# Patient Record
Sex: Female | Born: 2005
Health system: Southern US, Community
[De-identification: ages and names within clinical notes are randomized; demographics above are authoritative.]

## PROBLEM LIST (undated history)

## (undated) DIAGNOSIS — M41129 Adolescent idiopathic scoliosis, site unspecified: Secondary | ICD-10-CM

## (undated) DIAGNOSIS — H6993 Unspecified Eustachian tube disorder, bilateral: Secondary | ICD-10-CM

## (undated) DIAGNOSIS — J45909 Unspecified asthma, uncomplicated: Secondary | ICD-10-CM

## (undated) HISTORY — DX: Unspecified asthma, uncomplicated: J45.909

## (undated) HISTORY — DX: Unspecified eustachian tube disorder, bilateral: H69.93

## (undated) HISTORY — PX: OTHER SURGICAL HISTORY: SHX169

## (undated) HISTORY — DX: Adolescent idiopathic scoliosis, site unspecified: M41.129

## (undated) HISTORY — PX: ADENOIDECTOMY: SUR15

## (undated) HISTORY — PX: TYMPANOSTOMY TUBE PLACEMENT: SHX32

---
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
## 2023-01-12 DIAGNOSIS — M41124 Adolescent idiopathic scoliosis, thoracic region: Secondary | ICD-10-CM | POA: Diagnosis not present

## 2023-01-12 DIAGNOSIS — M549 Dorsalgia, unspecified: Secondary | ICD-10-CM | POA: Diagnosis not present

## 2023-01-12 DIAGNOSIS — M545 Low back pain, unspecified: Secondary | ICD-10-CM | POA: Diagnosis not present

## 2023-02-01 DIAGNOSIS — M545 Low back pain, unspecified: Secondary | ICD-10-CM | POA: Diagnosis not present

## 2023-02-01 DIAGNOSIS — M41124 Adolescent idiopathic scoliosis, thoracic region: Secondary | ICD-10-CM | POA: Diagnosis not present

## 2023-04-12 DIAGNOSIS — M4134 Thoracogenic scoliosis, thoracic region: Secondary | ICD-10-CM | POA: Diagnosis not present

## 2023-04-12 DIAGNOSIS — G4486 Cervicogenic headache: Secondary | ICD-10-CM | POA: Diagnosis not present

## 2023-04-12 DIAGNOSIS — M41124 Adolescent idiopathic scoliosis, thoracic region: Secondary | ICD-10-CM | POA: Diagnosis not present

## 2023-04-12 DIAGNOSIS — M545 Low back pain, unspecified: Secondary | ICD-10-CM | POA: Diagnosis not present

## 2023-04-12 DIAGNOSIS — M4312 Spondylolisthesis, cervical region: Secondary | ICD-10-CM | POA: Diagnosis not present

## 2023-07-11 DIAGNOSIS — G4486 Cervicogenic headache: Secondary | ICD-10-CM | POA: Diagnosis not present

## 2023-07-11 DIAGNOSIS — M545 Low back pain, unspecified: Secondary | ICD-10-CM | POA: Diagnosis not present

## 2023-07-11 DIAGNOSIS — M41124 Adolescent idiopathic scoliosis, thoracic region: Secondary | ICD-10-CM | POA: Diagnosis not present

## 2023-07-11 DIAGNOSIS — M41114 Juvenile idiopathic scoliosis, thoracic region: Secondary | ICD-10-CM | POA: Diagnosis not present

## 2024-04-12 DIAGNOSIS — H5213 Myopia, bilateral: Secondary | ICD-10-CM | POA: Diagnosis not present

## 2024-04-12 DIAGNOSIS — H1045 Other chronic allergic conjunctivitis: Secondary | ICD-10-CM | POA: Diagnosis not present

## 2024-09-25 ENCOUNTER — Ambulatory Visit (INDEPENDENT_AMBULATORY_CARE_PROVIDER_SITE_OTHER)

## 2024-09-25 VITALS — BP 98/80 | HR 111 | Ht 64.0 in | Wt 117.4 lb

## 2024-09-25 DIAGNOSIS — M419 Scoliosis, unspecified: Secondary | ICD-10-CM | POA: Diagnosis not present

## 2024-09-25 DIAGNOSIS — R000 Tachycardia, unspecified: Secondary | ICD-10-CM

## 2024-09-25 NOTE — Progress Notes (Signed)
 New Patient Visit   Physician: Lynley Killilea A Kearston Putman, MD  Patient: Karen Norris   DOB: March 18, 2006   18 y.o. Female  MRN: [id number] Visit Date: 09/25/2024   Chief Complaint  Patient presents with   Establish Care   Subjective  Karen Norris is a 18 y.o. female who presents today as a new patient to establish care.   HPI  Discussed the use of AI scribe software for clinical note transcription with the patient, who gave verbal consent to proceed.  History of Present Illness   Karen Norris is a 18 year old female who presents to establish care   Tachycardia and associated symptoms - HR during exercise normal - Lightheadedness associated with elevated heart rate, but this may just be exertional related - No current use of medications or supplements - Past EKG demonstrated tachycardia  Childhood asthma - Diagnosed in childhood - Currently asymptomatic, including absence of cough or wheezing even with exercise  Musculoskeletal pain and scoliosis - History of scoliosis causing pain after work - has seen orthopedics.  Thoracolumbar. - Previous physical therapy without significant improvement  Knee injury - History of meniscus tear  Covid-19 infections - Three prior COVID-19 infections, with symptoms worsening with each subsequent infection       ASSESSMENT & PLAN  Encounter Diagnoses  Name Primary?   Tachycardia Yes   Scoliosis of thoracolumbar spine, unspecified scoliosis type     No orders of the defined types were placed in this encounter.   Assessment and Plan    Tachycardia Persistent sinus tachycardia with heart rate >110-130 bpm in office. Differential includes anxiety hyperthyroidism, anemia, or arrhythmia. Previous EKGs showed tachycardia  - Order EKG to obtain baseline - showing overall unremarkable results, some artifact.  - Blood tests for thyroid function and anemia.  Monitor HR at home  - Consider Holter monitor if initial tests inconclusive,  but suspect this is more anxiety induced.  - Schedule follow-up call next week for lab results and management.  Scoliosis Scoliosis with back pain post-work. Previous physical therapy ineffective. Therapy may prevent worsening and maintain muscle tone.  General Health Maintenance Family history of breast and prostate cancer. Discussed early mammogram screening and flu vaccination. - Recommend mammograms at age 30 due to family history. - Offer flu vaccine for current season - declined     Objective  BP 98/80   Pulse (!) 111   Ht 5' 4 (1.626 m)   Wt 117 lb 6.4 oz (53.3 kg)   LMP 09/13/2024 (Approximate)   SpO2 99%   BMI 20.15 kg/m    Review of Systems  Constitutional:  Negative for chills, fever and weight loss.  Eyes:  Negative for blurred vision. h Respiratory:  Negative for cough and shortness of breath.   Cardiovascular:  Negative for chest pain and palpitations.  Skin:  Negative for rash.  Psychiatric/Behavioral:  Negative for depression. The patient is not nervous/anxious.      Physical Exam Physical Exam Vitals reviewed.  Constitutional:      Appearance: Normal appearance. Well-developed with normal weight.  HENT:     Head: Normocephalic and atraumatic.  Normal mucous membranes, no oral lesions Eyes:     Pupils: Pupils are equal, round, and reactive to light.  Neck:     Thyroid: No thyroid mass or thyromegaly.  Cardiovascular:     Rate and Rhythm: Elevated rate and regular rhythm. Normal heart sounds. Normal peripheral pulses Pulmonary:     Normal  breath sounds with normal effort Abdominal:   Abdomen is soft, without tenderness or noted hepatosplenomegaly Musculoskeletal:        General: No swelling or edema  Lymphadenopathy:     Cervical: No cervical adenopathy.  Skin:    General: Skin is warm and dry without noticeable rash. Neurological:     General: No focal deficit present.  Psychiatric:        Mood and Affect: Mood, behavior and cognition normal    Past Medical History:  Diagnosis Date   Adolescent scoliosis    Dysfunction of both eustachian tubes    Past Surgical History:  Procedure Laterality Date   ADENOIDECTOMY     MYRINGOTOMY W/ TUBES     TYMPANOSTOMY TUBE PLACEMENT     No family status information on file.   No family history on file. Social History   Socioeconomic History   Marital status: Single    Spouse name: Not on file   Number of children: Not on file   Years of education: Not on file   Highest education level: Some college, no degree  Occupational History   Not on file  Tobacco Use   Smoking status: Never   Smokeless tobacco: Current  Substance and Sexual Activity   Alcohol use: Not on file   Drug use: Not on file   Sexual activity: Not on file  Other Topics Concern   Not on file  Social History Narrative   Not on file   Social Drivers of Health   Financial Resource Strain: Low Risk  (09/23/2024)   Overall Financial Resource Strain (CARDIA)    Difficulty of Paying Living Expenses: Not very hard  Food Insecurity: No Food Insecurity (09/23/2024)   Hunger Vital Sign    Worried About Running Out of Food in the Last Year: Never true    Ran Out of Food in the Last Year: Never true  Transportation Needs: No Transportation Needs (09/23/2024)   PRAPARE - Administrator, Civil Service (Medical): No    Lack of Transportation (Non-Medical): No  Physical Activity: Insufficiently Active (09/23/2024)   Exercise Vital Sign    Days of Exercise per Week: 3 days    Minutes of Exercise per Session: 40 min  Stress: No Stress Concern Present (09/23/2024)   [employer] of Occupational Health - Occupational Stress Questionnaire    Feeling of Stress: Only a little  Social Connections: Moderately Integrated (09/23/2024)   Social Connection and Isolation Panel    Frequency of Communication with Friends and Family: Three times a week    Frequency of Social Gatherings with Friends and Family:  More than three times a week    Attends Religious Services: More than 4 times per year    Active Member of Clubs or Organizations: Yes    Attends Engineer, structural: More than 4 times per year    Marital Status: Never married   No outpatient medications prior to visit.   No facility-administered medications prior to visit.   No Known Allergies   There is no immunization history on file for this patient.  Health Maintenance  Topic Date Due   HPV VACCINES (1 - 3-dose series) Never done   HIV Screening  Never done   Meningococcal B Vaccine (1 of 2 - Standard) Never done   Hepatitis C Screening  Never done   DTaP/Tdap/Td (1 - Tdap) Never done   Hepatitis B Vaccines 19-59 Average Risk (1 of 3 - 19+ 3-dose  series) Never done   Influenza Vaccine  Never done   COVID-19 Vaccine (1 - 2025-26 season) Never done   Pneumococcal Vaccine  Aged Out    Patient Care Team: Evaleigh Mccamy A, MD as PCP - General (Family Medicine)  Depression Screen     No data to display           Karen DELENA Juneau, MD  [facility] [phone] (phone) [phone] (fax)  [facility]

## 2024-09-26 LAB — COMPREHENSIVE METABOLIC PANEL WITH GFR
AG Ratio: 1.9 (calc) (ref 1.0–2.5)
ALT: 13 U/L (ref 6–29)
AST: 20 U/L (ref 10–30)
Albumin: 5 g/dL (ref 3.6–5.1)
Alkaline phosphatase (APISO): 55 U/L (ref 31–125)
BUN/Creatinine Ratio: 13 (calc) (ref 6–22)
BUN: 13 mg/dL (ref 7–25)
CO2: 27 mmol/L (ref 20–32)
Calcium: 10.3 mg/dL — ABNORMAL HIGH (ref 8.6–10.2)
Chloride: 100 mmol/L (ref 98–110)
Creat: 1.04 mg/dL — ABNORMAL HIGH (ref 0.50–0.96)
Globulin: 2.7 g/dL (ref 1.9–3.7)
Glucose, Bld: 80 mg/dL (ref 65–139)
Potassium: 4.2 mmol/L (ref 3.5–5.3)
Sodium: 137 mmol/L (ref 135–146)
Total Bilirubin: 0.9 mg/dL (ref 0.2–1.2)
Total Protein: 7.7 g/dL (ref 6.1–8.1)
eGFR: 79 mL/min/1.73m2 (ref >=60)

## 2024-09-26 LAB — CBC WITH DIFFERENTIAL/PLATELET
Absolute Lymphocytes: 1357 {cells}/uL (ref 850–3900)
Absolute Monocytes: 948 {cells}/uL (ref 200–950)
Basophils Absolute: 35 {cells}/uL (ref 0–200)
Basophils Relative: 0.3 %
Eosinophils Absolute: 70 {cells}/uL (ref 15–500)
Eosinophils Relative: 0.6 %
HCT: 43.4 % (ref 35.0–45.0)
Hemoglobin: 14.1 g/dL (ref 11.7–15.5)
MCH: 30.1 pg (ref 27.0–33.0)
MCHC: 32.5 g/dL (ref 32.0–36.0)
MCV: 92.5 fL (ref 80.0–100.0)
MPV: 10.3 fL (ref 7.5–12.5)
Monocytes Relative: 8.1 %
Neutro Abs: 9290 {cells}/uL — ABNORMAL HIGH (ref 1500–7800)
Neutrophils Relative %: 79.4 %
Platelets: 303 Thousand/uL (ref 140–400)
RBC: 4.69 Million/uL (ref 3.80–5.10)
RDW: 12 % (ref 11.0–15.0)
Total Lymphocyte: 11.6 %
WBC: 11.7 Thousand/uL — ABNORMAL HIGH (ref 3.8–10.8)

## 2024-09-26 LAB — IRON,TIBC AND FERRITIN PANEL
%SAT: 35 % (ref 16–45)
Ferritin: 15 ng/mL — ABNORMAL LOW (ref 16–154)
Iron: 118 ug/dL — AB (ref 40–190)
TIBC: 334 ug/dL (ref 250–450)

## 2024-09-26 LAB — TSH+FREE T4: TSH W/REFLEX TO FT4: 1.86 m[IU]/L

## 2024-09-27 ENCOUNTER — Telehealth (INDEPENDENT_AMBULATORY_CARE_PROVIDER_SITE_OTHER)

## 2024-09-27 DIAGNOSIS — R000 Tachycardia, unspecified: Secondary | ICD-10-CM

## 2024-09-27 DIAGNOSIS — R790 Abnormal level of blood mineral: Secondary | ICD-10-CM | POA: Diagnosis not present

## 2024-09-27 MED ORDER — IRON (FERROUS SULFATE) 325 (65 FE) MG PO TABS: 325.0000 mg | ORAL_TABLET | Freq: Every day | ORAL | 1 refills | Status: AC

## 2024-09-27 NOTE — Progress Notes (Signed)
 Progress Note  Physician: Quantae Martel A Yehudit Fulginiti, MD   Patient contacted 09/27/24 at  2:00 PM EDT by a video enabled telemedicine application and verified that I am speaking with the correct person.   Patient is aware of limitations of evaluation by telemedicine The patient expressed understanding and agreed to proceed.   HPI: Karen Norris is a 18 y.o. female presenting on 09/27/2024 for No chief complaint on file. .    I personally reviewed and interpreted the patient labs today.  Karen Norris is a 18 year old female who presents for follow-up of recent blood work and tachycardia in office:   - No symptoms of active infection - No feeling of being unwell  - HR at home 80-110 without chest pain or SOB.  Office ECG NSR  Laboratory abnormalities - Recent blood work showed slight increase in creatinine and minor change in kidney function - Slightly elevated calcium level - Low iron and low ferritin - No anemia - No prior history of iron deficiency - Elevated white blood cell count and neutrophils - Normal thyroid function  Gynecologic symptoms - No heavy menstrual periods    Medical history:  Social history:  Relevant past medical, surgical, family and social history reviewed and updated as indicated. Interim medical history since our last visit reviewed.  Allergies and medications reviewed and updated.  DATA REVIEWED: CHART IN EPIC  ROS: Negative unless specifically indicated above in HPI.    Current Outpatient Medications:    Iron, Ferrous Sulfate, 325 (65 Fe) MG TABS, Take 325 mg by mouth daily., Disp: 90 tablet, Rfl: 1      Objective:  Telephone visit  Recent Results (from the past 2160 hours)  Comprehensive metabolic panel with GFR     Status: Abnormal   Collection Time: 09/25/24  2:37 PM  Result Value Ref Range   Glucose, Bld 80 65 - 139 mg/dL    Comment: .        Non-fasting reference interval .    BUN 13 7 - 25 mg/dL   Creat 8.95 (H)  9.49 - 0.96 mg/dL   eGFR 79 > OR = 60 fO/fpw/8.26f7   BUN/Creatinine Ratio 13 6 - 22 (calc)   Sodium 137 135 - 146 mmol/L   Potassium 4.2 3.5 - 5.3 mmol/L   Chloride 100 98 - 110 mmol/L   CO2 27 20 - 32 mmol/L   Calcium 10.3 (H) 8.6 - 10.2 mg/dL   Total Protein 7.7 6.1 - 8.1 g/dL   Albumin 5.0 3.6 - 5.1 g/dL   Globulin 2.7 1.9 - 3.7 g/dL (calc)   AG Ratio 1.9 1.0 - 2.5 (calc)   Total Bilirubin 0.9 0.2 - 1.2 mg/dL   Alkaline phosphatase (APISO) 55 31 - 125 U/L   AST 20 10 - 30 U/L   ALT 13 6 - 29 U/L  CBC with Differential/Platelet     Status: Abnormal   Collection Time: 09/25/24  2:37 PM  Result Value Ref Range   WBC 11.7 (H) 3.8 - 10.8 Thousand/uL   RBC 4.69 3.80 - 5.10 Million/uL   Hemoglobin 14.1 11.7 - 15.5 g/dL   HCT 56.5 64.9 - 54.9 %   MCV 92.5 80.0 - 100.0 fL   MCH 30.1 27.0 - 33.0 pg   MCHC 32.5 32.0 - 36.0 g/dL    Comment: For adults, a slight decrease in the calculated MCHC value (in the range of 30  to 32 g/dL) is most likely not clinically significant; however, it should be interpreted with caution in correlation with other red cell parameters and the patient's clinical condition.    RDW 12.0 11.0 - 15.0 %   Platelets 303 140 - 400 Thousand/uL   MPV 10.3 7.5 - 12.5 fL   Neutro Abs 9,290 (H) 1,500 - 7,800 cells/uL   Absolute Lymphocytes 1,357 850 - 3,900 cells/uL   Absolute Monocytes 948 200 - 950 cells/uL   Eosinophils Absolute 70 15 - 500 cells/uL   Basophils Absolute 35 0 - 200 cells/uL   Neutrophils Relative % 79.4 %   Total Lymphocyte 11.6 %   Monocytes Relative 8.1 %   Eosinophils Relative 0.6 %   Basophils Relative 0.3 %  Iron, TIBC and Ferritin Panel     Status: Abnormal   Collection Time: 09/25/24  2:37 PM  Result Value Ref Range   Iron 118 40 - 190 mcg/dL   TIBC 665 749 - 549 mcg/dL (calc)   %SAT 35 16 - 45 % (calc)   Ferritin 15 (L) 16 - 154 ng/mL  TSH + free T4     Status: None   Collection Time: 09/25/24  2:37 PM  Result Value Ref Range    TSH W/REFLEX TO FT4 1.86 mIU/L    Comment:           Reference Range .           > or = 20 Years  0.40-4.50 .                Pregnancy Ranges           First trimester    0.26-2.66           Second trimester   0.55-2.73           Third trimester    0.43-2.91        Assessment & Plan:  Tachycardia  Low ferritin level  Other orders -     Iron (Ferrous Sulfate); Take 325 mg by mouth daily.  Dispense: 90 tablet; Refill: 1   Tachycardia Elevated heart rate possibly due to low ferritin and situational anxiety. No signs of illness or infection. - Monitor heart rate at home. - Report if heart rate consistently exceeds 110 bpm.  Iron deficiency without anemia Low ferritin causing elevated heart rate due to reduced oxygen delivery. Even without anemia.    - Prescribed iron supplement and sent to pharmacy. - Recheck ferritin levels towards the end of January.   Fu Feb to review labs

## 2024-10-04 ENCOUNTER — Ambulatory Visit

## 2024-10-28 DIAGNOSIS — M5416 Radiculopathy, lumbar region: Secondary | ICD-10-CM | POA: Diagnosis not present

## 2024-10-28 DIAGNOSIS — M545 Low back pain, unspecified: Secondary | ICD-10-CM | POA: Diagnosis not present

## 2024-10-28 DIAGNOSIS — M41124 Adolescent idiopathic scoliosis, thoracic region: Secondary | ICD-10-CM | POA: Diagnosis not present

## 2024-11-18 ENCOUNTER — Ambulatory Visit

## 2024-11-21 NOTE — Therapy (Unsigned)
 OUTPATIENT PHYSICAL THERAPY EVALUATION   Patient Name: Karen Norris MRN: [id number] DOB:[birth year], 18 y.o., female 30 Date: 11/21/2024  END OF SESSION:   Past Medical History:  Diagnosis Date   Adolescent scoliosis    Asthma    Dysfunction of both eustachian tubes    Past Surgical History:  Procedure Laterality Date   ADENOIDECTOMY     MYRINGOTOMY W/ TUBES     TYMPANOSTOMY TUBE PLACEMENT     Patient Active Problem List   Diagnosis Date Noted   Low ferritin level 09/27/2024   Tachycardia 09/25/2024   Scoliosis 09/25/2024    PCP: Everlene Parris DELENA, MD   REFERRING PROVIDER: Glendia Garnette HERO, NP   REFERRING DIAG: left lumbar radiculitis, intermittent low back pain, adolescent idiopathic scoliosis of thoracic region  Rationale for Evaluation and Treatment: Rehabilitation  THERAPY DIAG:  No diagnosis found.  ONSET DATE: ***  SUBJECTIVE:                                                                                                                                                                                           SUBJECTIVE STATEMENT: Patient is here for PT evaluation and treatment using MDT/McKenzie approach by Cert. MDT physical therapist.  PERTINENT HISTORY:  Patient is a 18 y.o. female who presents to outpatient physical therapy with a referral for medical diagnosis left lumbar radiculitis, intermittent low back pain, adolescent idiopathic scoliosis of thoracic region. This patient's chief complaints consist of ***, leading to the following functional deficits: ***. Relevant past medical history and comorbidities include the following: she has Tachycardia; Scoliosis; and Low ferritin level on their problem list. she  has a past medical history of Adolescent scoliosis, Asthma, and Dysfunction of both eustachian tubes. she  has a past surgical history that includes Adenoidectomy; MYRINGOTOMY W/ TUBES; and Tympanostomy tube placement. Patient denies hx of  {redflags:27294}  Exercise history: ***    PAIN: Are you having pain? Yes NPRS: Current: ***/10,  Best: ***/10, Worst: ***/10. Pain location: *** Pain description: *** Aggravating factors: *** Relieving factors: ***   FUNCTIONAL LIMITATIONS: ***  LEISURE: ***   PRECAUTIONS: {Therapy precautions:24002}  WEIGHT BEARING RESTRICTIONS: {Yes ***/No:24003}  FALLS:  Has patient fallen in last 6 months? {fallsyesno:27318}  LIVING ENVIRONMENT: Lives with: {[facility] lives with:25569::lives with their family} Lives in: {Lives in:25570} Stairs: {opstairs:27293} Has following equipment at home: {Assistive devices:23999}  OCCUPATION: ***  PLOF: {PLOF:24004}  PATIENT GOALS: ***  NEXT MD VISIT: ***  [facility] Lumbar Spine Assessment  Patient Information  Age: 26 Referral: Ortho (NP from [facility]) Work Demands: *** Leisure Activities: *** Functional Limitation for  Present Episode: *** Outcome / Screening Score: *** NPRS (0-10): Current: ***/10,  Best: ***/10, Worst: ***/10. Present Symptoms: *** Present Since: ***, {ImprovingUnchangingWorsening:32694} Commenced As a Result of: {commenced as the result of:32695} Symptoms at Onset: {BackThighLeg:32696} Constant Symptoms: {BackThighLeg:32696} Intermittent Symptoms: {BackThighLeg:32696}  Worse - Bending: {AlwaysNeverSometimes:32692} - Sitting: {AlwaysNeverSometimes:32692} - Rising: {AlwaysNeverSometimes:32692} - Standing: {AlwaysNeverSometimes:32692} - Walking: {AlwaysNeverSometimes:32692} - Lying: {AlwaysNeverSometimes:32692} - AM: {AlwaysNeverSometimes:32692} - As the Day Progresses: {AlwaysNeverSometimes:32692} - PM: {AlwaysNeverSometimes:32692} - When Still: {AlwaysNeverSometimes:32692} - On the Move: {AlwaysNeverSometimes:32692} - Other: ***:   Better - Bending: {AlwaysNeverSometimes:32692} - Sitting: {AlwaysNeverSometimes:32692} - Standing: {AlwaysNeverSometimes:32692} - Walking:  {AlwaysNeverSometimes:32692} - Lying: {AlwaysNeverSometimes:32692} - AM: {AlwaysNeverSometimes:32692} - As the Day Progresses: {AlwaysNeverSometimes:32692} - PM: {AlwaysNeverSometimes:32692} - When Still: {AlwaysNeverSometimes:32692} - On the Move: {AlwaysNeverSometimes:32692} - Other: ***:   Sleep and Rest  Disturbed Sleep: {Yes/No:304960894} Sleeping Postures: {ProneSupSideRL:32697} Surface: ***  Medical History  Previous Spinal History: *** Previous Treatments: ***  SPECIFIC QUESTIONS - Cough: {yes/no:20286}, Sneeze: {yes/no:20286}, Strain: {yes/no:20286} - Bladder/Bowel: {Desc; normal/abnormal:11317::Normal} - Gait: {Desc; normal/abnormal:11317::Normal}  Medications: see chart General Health/Comorbidities:  She has Tachycardia; Scoliosis; and Low ferritin level on their problem list. she  has a past medical history of Adolescent scoliosis, Asthma, and Dysfunction of both eustachian tubes. she  has a past surgical history that includes Adenoidectomy; MYRINGOTOMY W/ TUBES; and Tympanostomy tube placement. Recent/Relevant Surgery: {Yes/No:304960894} History of Cancer: {Yes/No:304960894} Unexplained Weight Loss: {Yes/No:304960894} History of Trauma: {Yes/No:304960894} Imaging: Yes:   Scoliosis xray report from 10/28/2024:  EXAM: XR SCOLIOSIS AP AND LATERAL  DATE: 10/28/2024 4:10 PM  ACCESSION: [id number]  DICTATED: 10/28/2024 4:13 PM  INTERPRETATION LOCATION: [facility]   CLINICAL INDICATION: 18 years old Female with re - evaluate scoliosis  - M41.124 - Adolescent idiopathic scoliosis of thoracic region    COMPARISON: 04/12/2023   TECHNIQUE: Standing AP and lateral views of the spine.   FINDINGS/IMPRESSION:   Bones: No acute displaced fractures   Alignment: Neutral coronal balance. Neutral sagittal balance. Neutral pelvis. Similar moderate S-shaped scoliotic curvature of the thoracic spine with dextrocurvature of the upper thoracic spine, apex at T5-T6 and  levocurvature of the lower thoracic spine, apex at T10-T11.   Hardware: None   Degenerative: Preserved intervertebral disc spaces. Joint spaces are preserved.   Soft tissues: Unremarkable   Please note that this examination is designed to assess spinal alignment; if detailed evaluation of the cervical, thoracic, and/or lumbar spine is desired, consider dedicated radiographic studies.   Scoliosis Xray report from 04/12/2023:  EXAM: XR SCOLIOSIS AP AND LATERAL  DATE: 04/12/2023 4:19 PM  ACCESSION: [id number]  DICTATED: 04/12/2023 4:20 PM  INTERPRETATION LOCATION: [facility]   CLINICAL INDICATION: 18 years old Female with scoliosis  - M54.50 - Intermittent low back pain - M41.124 - Adolescent idiopathic scoliosis of thoracic region    COMPARISON: Cervical spine CT 11/02/2022, abdominal radiographs 09/03/2008   TECHNIQUE: Standing AP and lateral views of the spine.   FINDINGS:  S-shaped scoliosis of the thoracic spine, high dextroconvex with apex at T5-T6 (Cobb angle 28 degrees) and lower levoconvex with apex at T10-T11 (Cobb angle 31 degrees). No acute fracture. Straightening of the usual cervical lordosis and thoracic kyphosis. Grade 1 anterolisthesis of C3-C4 and C4-C5. No other spondylolisthesis. Intervertebral disc spaces are preserved. No acute abnormality involving the partially evaluated thorax and abdomen.   IMPRESSION:  Moderate S-shaped thoracic scoliosis as described below.   Grade 1 anterolisthesis of C3-C4 and C4-C5, new from comparison CT 11/02/2022.   Patient Goals/Expectations: ***  OBJECTIVE:   EXAMINATION  Sitting Posture: {LordoticNeutralKypohtic:32698} Change of Posture Effect: {BetterWorseNoEffect:32699} Standing Posture: {LordoticNeutralKypohtic:32698} Lateral Shift: {RightLeftNil:32700} Shift Relevance: {YES/NO:21197} Other Observations/Functional Baselines: ***  Neurological  Motor Deficit: *** Reflexes: *** Sensory Deficit: *** Neurodynamic Tests:  ***  Movement Loss (AROM) Movement Loss Symptoms  Flexion {MajModMinNil:32701} ***  Extension {MajModMinNil:32701} ***  Side Gliding R {MajModMinNil:32701} ***  Side Gliding L {MajModMinNil:32701} ***  Other: *** {MajModMinNil:32701} ***    Repeated Motions Testing Pre-Test Symptoms Test Movement Symptom During Symptom After Mechanical Response Key Functional Test  *** *** {ProducesAbolishesIncreases:32702} {BetterWorseNobetterNoworse:32703} {IncreasedROMDecreasedROMNoEffect:32704} {BetterWorseNoEffect:32699}  *** *** {ProducesAbolishesIncreases:32702} {BetterWorseNobetterNoworse:32703} {IncreasedROMDecreasedROMNoEffect:32704} {BetterWorseNoEffect:32699}  *** *** {ProducesAbolishesIncreases:32702} {BetterWorseNobetterNoworse:32703} {IncreasedROMDecreasedROMNoEffect:32704} {BetterWorseNoEffect:32699}  *** *** {ProducesAbolishesIncreases:32702} {BetterWorseNobetterNoworse:32703} {IncreasedROMDecreasedROMNoEffect:32704} {BetterWorseNoEffect:32699}  *** *** {ProducesAbolishesIncreases:32702} {BetterWorseNobetterNoworse:32703} {IncreasedROMDecreasedROMNoEffect:32704} {BetterWorseNoEffect:32699}  *** *** {ProducesAbolishesIncreases:32702} {BetterWorseNobetterNoworse:32703} {IncreasedROMDecreasedROMNoEffect:32704} {BetterWorseNoEffect:32699}  *** *** {ProducesAbolishesIncreases:32702} {BetterWorseNobetterNoworse:32703} {IncreasedROMDecreasedROMNoEffect:32704} {BetterWorseNoEffect:32699}   STATIC TESTS {SittingslouchedErect:32705}  OTHER TESTS: ***  Assessment  Provisional Classification: {DerangementDysfunctionPosturealOTHER:32925} Directional Preference: {FlexionExtensionSideglideOther:32927} Other: {SPINALOTHER:33021} Drivers of Pain/Disability: - Comorbidities: *** - Cognitive-Emotional: *** - Contextual: ***  Plan  PRINCIPLES OF MANAGEMENT Education: *** Exercise Type: ***  Frequency: ***  Other Exercises / Interventions: *** Management Goals: ***     TREATMENT                                                                                                                            PATIENT EDUCATION:  Education details: *** Person educated: {Person educated:25204} Education method: {Education Method:25205} Education comprehension: {Education Comprehension:25206}  HOME EXERCISE PROGRAM: ***  ASSESSMENT:  CLINICAL IMPRESSION: Patient is a 18 y.o. female referred to outpatient physical therapy with a medical diagnosis of left lumbar radiculitis, intermittent low back pain, adolescent idiopathic scoliosis of thoracic region who presents with signs and symptoms consistent with ***. Patient presents with significant *** impairments that are limiting ability to complete *** without difficulty. Patient will benefit from skilled physical therapy intervention to address current body structure impairments and activity limitations to improve function and work towards goals set in current POC in order to return to prior level of function or maximal functional improvement.   Mechanical sensitivities: ***   OBJECTIVE IMPAIRMENTS: {opptimpairments:25111}.   ACTIVITY LIMITATIONS: {activitylimitations:27494}  PARTICIPATION LIMITATIONS: {participationrestrictions:25113}  PERSONAL FACTORS: {Personal factors:25162} are also affecting patient's functional outcome.   REHAB POTENTIAL: {rehabpotential:25112}  CLINICAL DECISION MAKING: {clinical decision making:25114}  EVALUATION COMPLEXITY: {Evaluation complexity:25115}   GOALS: Goals reviewed with patient? {yes/no:20286}  SHORT TERM GOALS: Target date: 12/05/2024  Patient will be independent with initial home exercise program for self-management of symptoms. Baseline: {HEPbaseline4:27310} (11/21/2024); Goal status: INITIAL  LONG TERM GOALS: Target date: 02/13/2025  Patient will be independent with a long-term home exercise program for self-management of symptoms.  Baseline: {HEPbaseline4:27310} (11/21/2024); Goal  status: INITIAL  2.  Patient will demonstrate improved {SarasLTGPRO:32233} to demonstrate improvement in overall condition and self-reported functional ability.  Baseline: {Sarasgoalbaseline:32234} (11/21/2024); Goal status: INITIAL  3.  *** Baseline: {Sarasgoalbaseline:32234} (11/21/2024); Goal status: INITIAL  4.  *** Baseline: {Sarasgoalbaseline:32234} (11/21/2024); Goal status: INITIAL  5.  Patient will demonstrate improvement in Patient Specific Functional Scale (PSFS) of equal or greater than 8/10 points to reflect clinically significant improvement in patient's most valued functional activities. Baseline: {Sarasgoalbaseline:32234} (11/21/2024); Goal status: INITIAL  6.  Patient will report NPRS equal or less than 3/10 during functional activities during the last 2 weeks to improve their abilitly to complete community, work and/or recreational activities with less limitation. Baseline: ***/10 (11/21/2024); Goal status: INITIAL   PLAN:  PT FREQUENCY: {rehab frequency:25116}  PT DURATION: {rehab duration:25117}  PLANNED INTERVENTIONS: {rehab planned interventions:25118::97110-Therapeutic exercises,97530- Therapeutic [phone]- Neuromuscular re-education,97535- Self Rjmz,02859- Manual therapy,Patient/Family education}.  PLAN FOR NEXT SESSION: ***   Camie SAUNDERS. Juli, PT, DPT, Cert. MDT, PRA-C 11/21/2024, 4:17 PM  [facility] [address] [place], [place] [address] P: [phone] I F: [phone]

## 2024-11-25 ENCOUNTER — Ambulatory Visit: Attending: Orthopedic | Admitting: Physical Therapy

## 2024-11-25 ENCOUNTER — Encounter: Payer: Self-pay | Admitting: Physical Therapy

## 2024-11-25 DIAGNOSIS — M542 Cervicalgia: Secondary | ICD-10-CM | POA: Insufficient documentation

## 2024-11-25 DIAGNOSIS — M5459 Other low back pain: Secondary | ICD-10-CM | POA: Insufficient documentation

## 2024-11-25 DIAGNOSIS — M41124 Adolescent idiopathic scoliosis, thoracic region: Secondary | ICD-10-CM | POA: Diagnosis present

## 2024-11-25 DIAGNOSIS — M5416 Radiculopathy, lumbar region: Secondary | ICD-10-CM | POA: Insufficient documentation

## 2024-11-25 DIAGNOSIS — M546 Pain in thoracic spine: Secondary | ICD-10-CM | POA: Insufficient documentation

## 2024-11-25 DIAGNOSIS — M41125 Adolescent idiopathic scoliosis, thoracolumbar region: Secondary | ICD-10-CM

## 2024-12-02 ENCOUNTER — Ambulatory Visit: Admitting: Physical Therapy

## 2024-12-09 ENCOUNTER — Ambulatory Visit: Admitting: Physical Therapy

## 2024-12-19 ENCOUNTER — Ambulatory Visit: Attending: Orthopedic | Admitting: Physical Therapy

## 2024-12-19 VITALS — BP 113/77 | HR 89

## 2024-12-19 DIAGNOSIS — M5416 Radiculopathy, lumbar region: Secondary | ICD-10-CM | POA: Diagnosis present

## 2024-12-19 DIAGNOSIS — M542 Cervicalgia: Secondary | ICD-10-CM | POA: Insufficient documentation

## 2024-12-19 DIAGNOSIS — M546 Pain in thoracic spine: Secondary | ICD-10-CM | POA: Insufficient documentation

## 2024-12-19 DIAGNOSIS — M41124 Adolescent idiopathic scoliosis, thoracic region: Secondary | ICD-10-CM | POA: Insufficient documentation

## 2024-12-19 DIAGNOSIS — M5459 Other low back pain: Secondary | ICD-10-CM | POA: Insufficient documentation

## 2024-12-19 NOTE — Therapy (Signed)
 " OUTPATIENT PHYSICAL THERAPY TREATMENT   Patient Name: Karen Norris MRN: [id number] DOB:05-02-2006, 19 y.o., female Today's Date: 12/19/2024  END OF SESSION:  PT End of Session - 12/19/24 0908     Visit Number 2    Number of Visits 17    Date for Recertification  02/17/25    Authorization Type [employer] reporting period from 11/25/2024    Authorization Time Period VL 30 per cal yr/30 remain    Authorization - Visit Number 1    Authorization - Number of Visits 30    Progress Note Due on Visit 10    PT Start Time 0903    PT Stop Time 0949    PT Time Calculation (min) 46 min    Activity Tolerance Patient tolerated treatment well    Behavior During Therapy [facility] for tasks assessed/performed           Past Medical History:  Diagnosis Date   Adolescent scoliosis    Asthma    Dysfunction of both eustachian tubes    Past Surgical History:  Procedure Laterality Date   ADENOIDECTOMY     MYRINGOTOMY W/ TUBES     TYMPANOSTOMY TUBE PLACEMENT     Patient Active Problem List   Diagnosis Date Noted   Low ferritin level 09/27/2024   Tachycardia 09/25/2024   Scoliosis 09/25/2024    PCP: Everlene Parris DELENA, MD   REFERRING PROVIDER: Glendia Garnette HERO, NP   REFERRING DIAG: left lumbar radiculitis, intermittent low back pain, adolescent idiopathic scoliosis of thoracic region  Rationale for Evaluation and Treatment: Rehabilitation  THERAPY DIAG:  Pain in thoracic spine  Other low back pain  Radiculopathy, lumbar region  Cervicalgia  Adolescent idiopathic scoliosis of thoracic region  ONSET DATE: P1 chronic over many years (cannot remember), P2 about 1 year prior to eval, P3 two years ago  SUBJECTIVE:                                                                                                                                                                                            PERTINENT HISTORY:  Patient is a 19 y.o. female who presents to outpatient  physical therapy with a referral for medical diagnosis left lumbar radiculitis, intermittent low back pain, adolescent idiopathic scoliosis of thoracic region. This patient's chief complaints consist of pain in the left thoracic spine just medial to scapula, low back pain with intermittent radiation down the L LE to the heel, left sided neck pain leading to the following functional deficits: difficulty with long work days or procedures, lifting too heavy, sitting, sleeping, standing, employment/homemaking. Relevant past medical  history and comorbidities include the following: she has Tachycardia; Scoliosis; and Low ferritin level on their problem list. she  has a past medical history of Adolescent scoliosis, Asthma, and Dysfunction of both eustachian tubes. she  has a past surgical history that includes Adenoidectomy; MYRINGOTOMY W/ TUBES; and Tympanostomy tube placement. Patient denies hx of cancer, stroke, seizures, diabetes, unexplained weight loss, unexplained changes in bowel or bladder problems, unexplained stumbling or dropping things, osteoporosis, and spinal surgery   SUBJECTIVE STATEMENT: Patient states her pain has been a little better maybe. She thinks this is likely related to not working as much over the holidays. Her lower back has not been hurting as much. The pain has not gone down her left leg since she was last at PT. She could not do her HEP every 2 hours at work. She did it at home maybe every 4 hours. It caused pressure in the left thoracic spine but she could not tell if this was good pain or not. She states the pain she gets can be immediate or after working for a while.   PAIN: NPRS:  P1 (left thoracic spine): 1/10 P2 (lumbar and L leg): 0/10 P3 (L neck): 0/10  From initial PT evaluation on 11/25/24:  NPRS:  P1: Current: 2/10,  Best: 1/10, Worst: 6.5/10. P2: Current: 2/10,  Best: 0/10, Worst: 7/10. P3: Current: 0/10,  Best: 0/10, Worst: 4/10. Pain location: left thoracic  spine just medial to the scapula (P1). She also has pain in the base of her lumbar spine that radiates to the side of her left hip and down to the heel (P2). L sided neck pain over the left base of the neck (P3).  Pain description: pulling, tightness, paresthesia to the L foot Aggravating factors: see below (focus on P1) Relieving factors: see below (focus on P1)   PRECAUTIONS: None  WEIGHT BEARING RESTRICTIONS: No  FALLS:  Has patient fallen in last 6 months? No  PATIENT GOALS: making her back stronger, developing a good home program that she can do every day like stretchs or something   OBJECTIVE:   SELF-REPORTED FUNCTION THE PATIENT SPECIFIC FUNCTIONAL SCALE Place score of 0-10 (0 = unable to perform activity and 10 = able to perform activity at the same level as before injury or problem) Activity Date:          2.     3.     4.      Total Score     Total Score = Sum of activity scores/number of activities Minimally Detectable Change: 3 points (for single activity); 2 points (for average score) [facility] (nd). The Patient Specific Functional Scale . Retrieved from [email or web address]    Standing Thoracic Movement Loss (AROM) Movement Loss Symptoms  Flexion nil Stretching in the L thoracic spine  Extension nil Tight in lower back   Side Bending R nil none  Side Bending L nil none  Rotation R nil none  Rotation L min L thoracic pushing   Repeated Motions Pre-Test Symptoms Test Movement Symptom During Symptom After Mechanical Response Key Functional Test  1/10 P1 REIseated for thoracic spine 1x10 Increased worse no effect    Palpation:  Muscle tension and concordant pain over lower thoracic region at left of spine near apex of convexity.    MMT Lower trap:  R: 4/5 L: 4/5  Middle trap:  R: 4+/5 L: 4+/5  TREATMENT  Therapeutic exercise: therapeutic exercises that incorporate ONE parameter at one or more areas of the body to centralize symptoms, develop strength and endurance, range of motion, and flexibility required for successful completion of functional activities.  Vitals measurement for system's review (see above)  AROM to assess response  Seated repeated thoracic extension at chair with hands clasped behind neck for support 1x10 Good carry over, no improvement Removed from HEP  MMT for mid and lower traps (see above)  Therapeutic activities: dynamic therapeutic activities incorporating MULTIPLE parameters or areas of the body designed to achieve improved functional performance.  Quadruped horizontal shoulder abduction with supporting shoulder in a serratus plus to improve strength and endurance of shoulder girdle, trunk, and periscapular muscles for improved tolerance for functional activities such as working, pushing, and pulling.  2x10 each side Progressed to thumbs up in abducting limb (shoulder ER) Extensive cuing at first to find form at first and frequent cuing to prevent retraction of supporting scapula Fatiguing for shoulders Added to HEP  Prone lower trap and serratus activation/endurance with hands over head in diamond shape, moving into B shoulder ER with elbows on the mat to improve strength and endurance of shoulder girdle, trunk, and periscapular muscles for improved tolerance for functional activities such as working  1x20  Cuing for form Fatiguing for shoulders Added to HEP  Standing single arm row in stride stance, focusing on scapular mechanics to improve pulling and reinforce anti-rotation to improve tolerance to functional activities.  2x10 at 25# each side Added to HEP Provided silverTB and webbing door anchor for home use  Manual therapy: to reduce pain and tissue tension, improve range of motion, neuromodulation, in order to  promote improved ability to complete functional activities. PRONE STM to L lower thoracic paraspinals at the site of pain (Improved pain)  Pt required multimodal cuing for proper technique and to facilitate improved neuromuscular control, strength, range of motion, and functional ability resulting in improved performance and form.  PATIENT EDUCATION:  Education details: Exercise purpose/form. Self management techniques. Person educated: Patient Education method: Explanation, Demonstration, and Handouts Education comprehension: verbalized understanding, returned demonstration, and needs further education  HOME EXERCISE PROGRAM: Access Code: [facility] URL: [email or web address] Date: 12/19/2024 Prepared by: Camie Cleverly  Exercises - Quadruped shoulder horizontal abduction  - 3 x weekly - 2 sets - 10 reps - Prone B Shoulder ER Overhead for Serratus and Lower Traps  - 3 x weekly - 2 sets - 10-20 reps - 5 seconds hold - Single arm row with band/cable  - 3 x weekly - 2-3 sets - 10 reps   ASSESSMENT:  CLINICAL IMPRESSION: Patient returns to PT nearly 3.5 weeks following her initial evaluation (lack of visits due to scheduling limitations). Patient's thoracic pain does not appear to be responding positively to repeated extension at the thoracic spine in clinic or during trial of frequent sets at home. She will continue to be monitored for potential response to repeated motions, but at this time her structural abnormalities from scoliosis combined with decreased active support of the area make more sense to address clinically. Patient responded with at least short term pain reduction to STM at the tight muscles in the region of pain and was challenged by exercises designed to improve motor control, strength, and endurance to improve active support around the spine. Patient was able to complete these without increased pain and with sufficient ability to add to HEP. She is expected to  need review for best results and continued progressions  throughout the course of her care. Her low back pain has improved since her initial evaluation but will be more specifically addressed at a future date as appropriate, as will her neck pain. Patient would benefit from continued management of limiting condition by skilled physical therapist to address remaining impairments and functional limitations to work towards stated goals and return to PLOF or maximal functional independence.   Mechanical sensitivities:   MDT Assessment (thoracic pain only) Classification: OTHER: structural (scoliosis)  From initial PT evaluation on 11/25/24:  Patient is a 19 y.o. female referred to outpatient physical therapy with a medical diagnosis of left lumbar radiculitis, intermittent low back pain, adolescent idiopathic scoliosis of thoracic region who presents with signs and symptoms consistent with neck, thoracic, and low back pain with left lumbar radiculopathy in the setting of adolescent idiopathic scoliosis at the thoracic spine. Patient's main concern today was the thoracic spine pain that she feels medial and distal to the L scapula near the apex of her left convexity. Muscles there were tender and tight to palpation with reproduction of patient's pain. MDT classification approach used per referring provider request. Patient did have some small reduction in pain following prone repeated thoracic extension that could suggest an MDT derangement syndrome there so she was given a trial of reapted thoracic extension to complete at home until next visit to help confirm or rule out classification as thoracic derangement syndrome with  extension preference. Patient's c-spine was examined as well due to left sided pain there and common pattern of radiation to the left thoracic spine from this region. Unable to confirm that thoracic pain is referring from cervical spine and this is less likely as the thoracic pain is more  caudal than is usually referred from the cervical spine and there are significant changes in anatomy at the location of the pain on the thoracic spine. She is relatively deconditioned and her pain could be explained by limitations in muscle performance needed to sustain the postures she needs daily with the abnormal mechanics caused by miss-alignment of the spine related to scoliosis. She would likely benefit from postural and shoulder girdle strengthening to improve her activity tolerance and pain. Low back and left radicular symptoms not examined fully today, but per subjective exam, pt appears to have chronic low back pain with left radiculopathy.  This has not been classified with MDT classification but preliminary classification is lumbar derangement syndrome vs other. Neck pain is also lumbar derangement syndrome vs other. Patient presents with significant pain, posture, joint structure, muscle performance (strength/power/endurance), joint stiffness, muscle tension, paresthesia, knowledge, and activity tolerance impairments that are limiting ability to complete usual activities such as long work days or procedures, lifting too heavy, sitting, sleeping, standing, employment/homemaking without difficulty. Patient will benefit from skilled physical therapy intervention to address current body structure impairments and activity limitations to improve function and work towards goals set in current POC in order to return to prior level of function or maximal functional improvement.   OBJECTIVE IMPAIRMENTS: decreased activity tolerance, decreased endurance, decreased knowledge of condition, decreased ROM, decreased strength, hypomobility, increased muscle spasms, impaired flexibility, improper body mechanics, postural dysfunction, and pain.   ACTIVITY LIMITATIONS: carrying, lifting, bending, sitting, standing, sleeping, and caring for others  PARTICIPATION LIMITATIONS: community activity and  occupation  PERSONAL FACTORS: Fitness, Past/current experiences, Profession, Time since onset of injury/illness/exacerbation, and 3+ comorbidities:  Tachycardia; Scoliosis; and Low ferritin level on their problem list. she  has a past medical history of Adolescent scoliosis,  Asthma, and Dysfunction of both eustachian tubes. she  has a past surgical history that includes Adenoidectomy; MYRINGOTOMY W/ TUBES; and Tympanostomy tube placement are also affecting patient's functional outcome.   REHAB POTENTIAL: Good  CLINICAL DECISION MAKING: Evolving/moderate complexity  EVALUATION COMPLEXITY: Moderate   GOALS: Goals reviewed with patient? No  SHORT TERM GOALS: Target date: 12/09/2024  Patient will be independent with initial home exercise program for self-management of symptoms. Baseline: Initial HEP provided at IE (11/25/2024); Goal status: In progress  LONG TERM GOALS: Target date: 02/17/2025  Patient will be independent with a long-term home exercise program for self-management of symptoms.  Baseline: Initial HEP provided at IE (11/25/2024); Goal status: In progress  2.  Patient will demonstrate improved Patient Specific Functional Scale (PSFS) to equal or greater than 8/10 to demonstrate improvement in overall condition and self-reported functional ability.  Baseline: to be measured at visit 2 as appropriate (11/25/2024); Goal status: In progress  3.  Patient will demonstrate full expected thoracic and lumbar AROM without increased symptoms except intermittent end range discomfort to improve her ability to complete work, lifting, and sleeping.  Baseline: thoracic spine limited and/or painful in extension and L rotation (11/25/2024); Goal status: In progress  4.  Patient will demonstrate B lumbar neural tension equal and absent except normal end range non-concordant pulling to demonstrate improvement in nervous system sensitivity and improvement of condition for improved activity  tolerance for sitting and working.  Baseline: to be measured at future visit (11/25/2024); Goal status: In progress  5.  Patient will report NPRS equal or less than 2/10 during functional activities during the last 2 weeks to improve their abilitly to complete community, work and/or recreational activities with less limitation. Baseline: 7/10 (11/25/2024); Goal status: In progress   PLAN:  PT FREQUENCY: 2x/week  PT DURATION: 12 weeks  PLANNED INTERVENTIONS: 97164- PT Re-evaluation, 97750- Physical Performance Testing, 97110-Therapeutic exercises, 97530- Therapeutic activity, [id number]- Neuromuscular re-education, 97535- Self Care, 02859- Manual therapy, [id number]- Electrical stimulation (unattended), 20560 (1-2 muscles), 20561 (3+ muscles)- Dry Needling, Patient/Family education, Cryotherapy, and Moist heat.  PLAN FOR NEXT SESSION: update HEP as appropriate, self STM with LAX ball, assess response to repeated motions over time and consider checking for response to repeated rotation. Proceed with improving active support of the spine and shoulder girdle.  retrain motor patterns, regain ROM, improve strength and resilience needed for  performing desired functional performance with appropriate ROM, strength, power, and endurance. Manual therapy as needed. Consider checking traction alleviation for cervical, thoracic, and lumbar spine. Further examine and treat lumbar spine when appropriate. Consider intrascapular and postural strengthening.   Camie SAUNDERS. Juli, PT, DPT, Cert. MDT, PRA-C 12/19/24, 10:52 AM  [facility] [address] [place], [place] [address] P: [phone] I F: [phone]   "

## 2024-12-20 ENCOUNTER — Encounter: Payer: Self-pay | Admitting: Physical Therapy

## 2024-12-23 ENCOUNTER — Encounter: Payer: Self-pay | Admitting: Physical Therapy

## 2024-12-23 ENCOUNTER — Ambulatory Visit: Admitting: Physical Therapy

## 2024-12-23 DIAGNOSIS — M546 Pain in thoracic spine: Secondary | ICD-10-CM | POA: Diagnosis not present

## 2024-12-23 DIAGNOSIS — M5459 Other low back pain: Secondary | ICD-10-CM

## 2024-12-23 DIAGNOSIS — M5416 Radiculopathy, lumbar region: Secondary | ICD-10-CM

## 2024-12-23 DIAGNOSIS — M41124 Adolescent idiopathic scoliosis, thoracic region: Secondary | ICD-10-CM

## 2024-12-23 DIAGNOSIS — M542 Cervicalgia: Secondary | ICD-10-CM
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
## 2024-12-25 NOTE — Therapy (Signed)
 " OUTPATIENT PHYSICAL THERAPY TREATMENT   Patient Name: Karen Norris MRN: [id number] DOB:2006/07/11, 19 y.o., female Today's Date: 12/26/2024  END OF SESSION:  PT End of Session - 12/26/24 1038     Visit Number 4    Number of Visits 17    Date for Recertification  02/17/25    Authorization Type [employer] reporting period from 11/25/2024    Authorization Time Period VL 30 per cal yr/30 remain    Authorization - Number of Visits 30    Progress Note Due on Visit 10    PT Start Time 1037    PT Stop Time 1115    PT Time Calculation (min) 38 min    Activity Tolerance Patient tolerated treatment well    Behavior During Therapy WFL for tasks assessed/performed             Past Medical History:  Diagnosis Date   Adolescent scoliosis    Asthma    Dysfunction of both eustachian tubes    Past Surgical History:  Procedure Laterality Date   ADENOIDECTOMY     MYRINGOTOMY W/ TUBES     TYMPANOSTOMY TUBE PLACEMENT     Patient Active Problem List   Diagnosis Date Noted   Low ferritin level 09/27/2024   Tachycardia 09/25/2024   Scoliosis 09/25/2024    PCP: Everlene Parris DELENA, MD   REFERRING PROVIDER: Glendia Garnette HERO, NP   REFERRING DIAG: left lumbar radiculitis, intermittent low back pain, adolescent idiopathic scoliosis of thoracic region  Rationale for Evaluation and Treatment: Rehabilitation  THERAPY DIAG:  Pain in thoracic spine  Other low back pain  Radiculopathy, lumbar region  Cervicalgia  Adolescent idiopathic scoliosis of thoracic region  ONSET DATE: P1 chronic over many years (cannot remember), P2 about 1 year prior to eval, P3 two years ago  SUBJECTIVE:                                                                                                                                                                                            PERTINENT HISTORY:  Patient is a 19 y.o. female who presents to outpatient physical therapy with a  referral for medical diagnosis left lumbar radiculitis, intermittent low back pain, adolescent idiopathic scoliosis of thoracic region. This patient's chief complaints consist of pain in the left thoracic spine just medial to scapula, low back pain with intermittent radiation down the L LE to the heel, left sided neck pain leading to the following functional deficits: difficulty with long work days or procedures, lifting too heavy, sitting, sleeping, standing, employment/homemaking. Relevant past medical history and comorbidities include the following:  she has Tachycardia; Scoliosis; and Low ferritin level on their problem list. she  has a past medical history of Adolescent scoliosis, Asthma, and Dysfunction of both eustachian tubes. she  has a past surgical history that includes Adenoidectomy; MYRINGOTOMY W/ TUBES; and Tympanostomy tube placement. Patient denies hx of cancer, stroke, seizures, diabetes, unexplained weight loss, unexplained changes in bowel or bladder problems, unexplained stumbling or dropping things, osteoporosis, and spinal surgery  Exercise history: has weights that she can use (belong to her brother). Unsure of weight.    SUBJECTIVE STATEMENT: Patient states she is a little sore in her left thoracic spine today. Neck was sore yesterday. She is agreeable to examining her low back and leg pain today.   PAIN: NPRS:  P1 (left thoracic spine): 1/10 P2 (lumbar and L leg): 0/10 left low back and glute P3 (L neck): 0/10  From initial PT evaluation on 11/25/24:  NPRS:  P1: Current: 2/10,  Best: 1/10, Worst: 6.5/10. P2: Current: 2/10,  Best: 0/10, Worst: 7/10. P3: Current: 0/10,  Best: 0/10, Worst: 4/10. Pain location: left thoracic spine just medial to the scapula (P1). She also has pain in the base of her lumbar spine that radiates to the side of her left hip and down to the heel (P2). L sided neck pain over the left base of the neck (P3).  Pain description: pulling, tightness,  paresthesia to the L foot Aggravating factors: see below (focus on P2) Relieving factors: see below (focus on P2)   Worse (P2) - Bending: Sometimes - Sitting: Sometimes - Rising: Never - Standing: Never - Walking: Sometimes - Lying: Never - AM: Sometimes - As the Day Progresses: Sometimes - PM: Sometimes - When Still: Sometimes - On the Move: Sometimes - Other: working out at [employer] (will feel after or in the middle of it)  Better (P2) - Bending: Sometimes - Sitting: Sometimes - Standing: Sometimes - Walking: Sometimes - Lying: Always - AM: Sometimes - As the Day Progresses: Sometimes - PM: Sometimes - When Still: Sometimes - On the Move: Sometimes - Other: putting heat on back  Sleep and Rest Disturbed Sleep: not usually Sleeping Postures: sleeping on her back side bent to the right with left leg up Surface: medium   PRECAUTIONS: None  WEIGHT BEARING RESTRICTIONS: No  FALLS:  Has patient fallen in last 6 months? No  PATIENT GOALS: making her back stronger, developing a good home program that she can do every day like stretchs or something   OBJECTIVE:    EXAMINATION  Neurological  Neurodynamic Tests: SLR equal bilaterally, tension in thigh sensitive to ankle at approx 45 degrees. Flexed Slump equal bilaterally with tension sensitive to foot not ankle at approx 20 degrees flexion.   Movement Loss (AROM) Movement Loss Symptoms  Flexion mod Fingers 2 inches below knees, pain in left low back upper glute after return.   Extension min Pressure across the low back, slightly more on left   Side Gliding R nil   Side Gliding L min Concordant pain at left low back and top of glute    Repeated Motions Testing Pre-Test Symptoms Test Movement Symptom During Symptom After Mechanical Response Key Functional Test  2/10 left low back REIL 1x10 increases Worse in prone     REIL with hands elevated decreases centralized increased ROM left side glide and flexion  without pain.       TREATMENT  Therapeutic exercise: therapeutic exercises that incorporate ONE parameter at one or more areas of the body to centralize symptoms, develop strength and endurance, range of motion, and flexibility required for successful completion of functional activities. Examination of low back (see above)  REIL with hands elevated  1x10  Modified of REIL with hands elevated from quadruped 1x3 Video placed on pt's phone for HEP  Trial of sustained lumbar extension with elbows on chair Not well tolerated unable to relax (discontinued)  Trial of sitting with lumbar roll and education about avoiding flexion and POC  Pt required multimodal cuing for proper technique and to facilitate improved neuromuscular control, strength, range of motion, and functional ability resulting in improved performance and form.  PATIENT EDUCATION:  Education details: Exercise purpose/form. Self management techniques. Person educated: Patient Education method: Explanation, Demonstration, and Handouts Education comprehension: verbalized understanding, returned demonstration, and needs further education  HOME EXERCISE PROGRAM: Access Code: [facility] URL: [email or web address] Date: 12/19/2024 Prepared by: Camie Cleverly  Exercises - Quadruped shoulder horizontal abduction  - 3 x weekly - 2 sets - 10 reps - Prone B Shoulder ER Overhead for Serratus and Lower Traps  - 3 x weekly - 2 sets - 10-20 reps - 5 seconds hold - Single arm row with band/cable  - 3 x weekly - 2-3 sets - 10 reps   ASSESSMENT:  CLINICAL IMPRESSION: Patient's low back and left leg pain (P2) examined today with MDT approach. Patient with brief centralization with REIL but needed hands on elevated blocks to reach end range extension of the lumbar spine and get centralization. She has limited  UE strength and struggled to perform this exercise, making it unlikely she will be able to tolerated 10 reps every 2 hours. She was shown how to perform modification from quadruped (able to perform) and static positioning using a chair (unable to perform well) as well as educated about avoiding flexion and sitting with a lumbar roll. Her pain returned by end of visit at the left side of her low back (2/10), but repeated motions at home should help confirm or disconfirm provisional MDT classification of Lumbar derangement syndrome with extension preference. Patient will also likely need strengthening to the low back due to low muscle mass and strength. Patient reported confidence of 6/10 that she would perform the exercises for her low back every 2 hours when not at work and before work, at lunch, and after work on work days. Patient would benefit from continued management of limiting condition by skilled physical therapist to address remaining impairments and functional limitations to work towards stated goals and return to PLOF or maximal functional independence.   MDT Assessment  Thoracic pain Classification: OTHER: structural (scoliosis) Low back and leg pain Provisional Classification: lumbar derangement syndrome with extension preference   From initial PT evaluation on 11/25/24:  Patient is a 19 y.o. female referred to outpatient physical therapy with a medical diagnosis of left lumbar radiculitis, intermittent low back pain, adolescent idiopathic scoliosis of thoracic region who presents with signs and symptoms consistent with neck, thoracic, and low back pain with left lumbar radiculopathy in the setting of adolescent idiopathic scoliosis at the thoracic spine. Patient's main concern today was the thoracic spine pain that she feels medial and distal to the L scapula near the apex of her left convexity. Muscles there were tender and tight to palpation with reproduction of patient's pain. MDT  classification approach used per referring provider request. Patient did have some small reduction in pain following  prone repeated thoracic extension that could suggest an MDT derangement syndrome there so she was given a trial of reapted thoracic extension to complete at home until next visit to help confirm or rule out classification as thoracic derangement syndrome with  extension preference. Patient's c-spine was examined as well due to left sided pain there and common pattern of radiation to the left thoracic spine from this region. Unable to confirm that thoracic pain is referring from cervical spine and this is less likely as the thoracic pain is more caudal than is usually referred from the cervical spine and there are significant changes in anatomy at the location of the pain on the thoracic spine. She is relatively deconditioned and her pain could be explained by limitations in muscle performance needed to sustain the postures she needs daily with the abnormal mechanics caused by miss-alignment of the spine related to scoliosis. She would likely benefit from postural and shoulder girdle strengthening to improve her activity tolerance and pain. Low back and left radicular symptoms not examined fully today, but per subjective exam, pt appears to have chronic low back pain with left radiculopathy.  This has not been classified with MDT classification but preliminary classification is lumbar derangement syndrome vs other. Neck pain is also lumbar derangement syndrome vs other. Patient presents with significant pain, posture, joint structure, muscle performance (strength/power/endurance), joint stiffness, muscle tension, paresthesia, knowledge, and activity tolerance impairments that are limiting ability to complete usual activities such as long work days or procedures, lifting too heavy, sitting, sleeping, standing, employment/homemaking without difficulty. Patient will benefit from skilled physical therapy  intervention to address current body structure impairments and activity limitations to improve function and work towards goals set in current POC in order to return to prior level of function or maximal functional improvement.   OBJECTIVE IMPAIRMENTS: decreased activity tolerance, decreased endurance, decreased knowledge of condition, decreased ROM, decreased strength, hypomobility, increased muscle spasms, impaired flexibility, improper body mechanics, postural dysfunction, and pain.   ACTIVITY LIMITATIONS: carrying, lifting, bending, sitting, standing, sleeping, and caring for others  PARTICIPATION LIMITATIONS: community activity and occupation  PERSONAL FACTORS: Fitness, Past/current experiences, Profession, Time since onset of injury/illness/exacerbation, and 3+ comorbidities:  Tachycardia; Scoliosis; and Low ferritin level on their problem list. she  has a past medical history of Adolescent scoliosis, Asthma, and Dysfunction of both eustachian tubes. she  has a past surgical history that includes Adenoidectomy; MYRINGOTOMY W/ TUBES; and Tympanostomy tube placement are also affecting patient's functional outcome.   REHAB POTENTIAL: Good  CLINICAL DECISION MAKING: Evolving/moderate complexity  EVALUATION COMPLEXITY: Moderate   GOALS: Goals reviewed with patient? No  SHORT TERM GOALS: Target date: 12/09/2024  Patient will be independent with initial home exercise program for self-management of symptoms. Baseline: Initial HEP provided at IE (11/25/2024); Goal status: In progress  LONG TERM GOALS: Target date: 02/17/2025  Patient will be independent with a long-term home exercise program for self-management of symptoms.  Baseline: Initial HEP provided at IE (11/25/2024); Goal status: In progress  2.  Patient will demonstrate improved Patient Specific Functional Scale (PSFS) to equal or greater than 8/10 to demonstrate improvement in overall condition and self-reported functional ability.   Baseline: to be measured at visit 2 as appropriate (11/25/2024); 3.3/10 (12/23/24); Goal status: In progress  3.  Patient will demonstrate full expected thoracic and lumbar AROM without increased symptoms except intermittent end range discomfort to improve her ability to complete work, lifting, and sleeping.  Baseline: thoracic spine limited and/or painful in extension  and L rotation (11/25/2024); Goal status: In progress  4.  Patient will demonstrate B lumbar neural tension equal and absent except normal end range non-concordant pulling to demonstrate improvement in nervous system sensitivity and improvement of condition for improved activity tolerance for sitting and working.  Baseline: to be measured at future visit (11/25/2024); Goal status: In progress  5.  Patient will report NPRS equal or less than 2/10 during functional activities during the last 2 weeks to improve their abilitly to complete community, work and/or recreational activities with less limitation. Baseline: 7/10 (11/25/2024); Goal status: In progress   PLAN:  PT FREQUENCY: 2x/week  PT DURATION: 12 weeks  PLANNED INTERVENTIONS: 97164- PT Re-evaluation, 97750- Physical Performance Testing, 97110-Therapeutic exercises, 97530- Therapeutic activity, [id number]- Neuromuscular re-education, 97535- Self Care, 02859- Manual therapy, [id number]- Electrical stimulation (unattended), 20560 (1-2 muscles), 20561 (3+ muscles)- Dry Needling, Patient/Family education, Cryotherapy, and Moist heat.  PLAN FOR NEXT SESSION: confirm or disconfirm MDT provisional classification for low back/Leg. update HEP as appropriate, advance to lumbo/pelvic/hip strength motor control and condition progressing to work on hip hinge pattern when ready.  Proceed with improving active support of the spine and shoulder girdle.  retrain motor patterns, regain ROM, improve strength and resilience needed for  performing desired functional performance with appropriate ROM,  strength, power, and endurance. Manual therapy as needed. Consider checking traction alleviation for cervical, thoracic, and lumbar spine. Further examine and treat lumbar spine when appropriate. Consider intrascapular and postural strengthening.    MDT Plan (low back and L leg pain)  PRINCIPLES OF MANAGEMENT Education: centralization, peripheralization, avoidance of flexion  Exercise Type: REIL with hands elevated or modified REIL from quadruped with hands elevated  Frequency: 1x10 every 2 hours while awake  Other Exercises / Interventions: sitting with lumbar roll, avoidance of flexion Management Goals: confirm classification  Camie SAUNDERS. Juli, PT, DPT, Cert. MDT, PRA-C 12/26/24, 3:56 PM  [facility] [address] [place], [place] [address] P: [phone] I F: [phone]   "

## 2024-12-26 ENCOUNTER — Ambulatory Visit: Admitting: Physical Therapy

## 2024-12-26 DIAGNOSIS — M41124 Adolescent idiopathic scoliosis, thoracic region: Secondary | ICD-10-CM

## 2024-12-26 DIAGNOSIS — M542 Cervicalgia: Secondary | ICD-10-CM

## 2024-12-26 DIAGNOSIS — M5459 Other low back pain: Secondary | ICD-10-CM

## 2024-12-26 DIAGNOSIS — M546 Pain in thoracic spine: Secondary | ICD-10-CM

## 2024-12-26 DIAGNOSIS — M5416 Radiculopathy, lumbar region: Secondary | ICD-10-CM

## 2024-12-30 ENCOUNTER — Ambulatory Visit: Admitting: Physical Therapy

## 2025-01-02 ENCOUNTER — Encounter: Payer: Self-pay | Admitting: Physical Therapy

## 2025-01-02 ENCOUNTER — Ambulatory Visit: Admitting: Physical Therapy

## 2025-01-02 DIAGNOSIS — M41124 Adolescent idiopathic scoliosis, thoracic region: Secondary | ICD-10-CM

## 2025-01-02 DIAGNOSIS — M546 Pain in thoracic spine: Secondary | ICD-10-CM | POA: Diagnosis not present

## 2025-01-02 DIAGNOSIS — M542 Cervicalgia: Secondary | ICD-10-CM

## 2025-01-02 DIAGNOSIS — M5459 Other low back pain: Secondary | ICD-10-CM

## 2025-01-02 DIAGNOSIS — M5416 Radiculopathy, lumbar region: Secondary | ICD-10-CM
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
## 2025-01-09 ENCOUNTER — Ambulatory Visit

## 2025-01-13 ENCOUNTER — Ambulatory Visit: Admitting: Physical Therapy

## 2025-01-16 ENCOUNTER — Ambulatory Visit: Admitting: Physical Therapy

## 2025-01-20 ENCOUNTER — Ambulatory Visit: Admitting: Physical Therapy

## 2025-01-23 ENCOUNTER — Ambulatory Visit: Admitting: Physical Therapy

## 2025-01-27 ENCOUNTER — Ambulatory Visit: Admitting: Physical Therapy

## 2025-01-30 ENCOUNTER — Ambulatory Visit: Admitting: Physical Therapy
